# Patient Record
Sex: Male | Born: 1965
Health system: Southern US, Community
[De-identification: ages and names within clinical notes are randomized; demographics above are authoritative.]

## PROBLEM LIST (undated history)

## (undated) ENCOUNTER — Emergency Department (HOSPITAL_COMMUNITY): Admission: EM | Payer: 59 | Source: Home / Self Care

---

## 2010-11-01 ENCOUNTER — Emergency Department (HOSPITAL_COMMUNITY)
Admission: EM | Admit: 2010-11-01 | Discharge: 2010-11-01 | Discharge status: Against Medical Advice | Payer: 59 | Attending: Emergency Medicine | Admitting: Emergency Medicine

## 2010-11-01 DIAGNOSIS — R109 Unspecified abdominal pain: Secondary | ICD-10-CM | POA: Insufficient documentation

## 2010-11-01 LAB — URINALYSIS, ROUTINE W REFLEX MICROSCOPIC
Bilirubin Urine: NEGATIVE
Nitrite: NEGATIVE
Specific Gravity, Urine: 1.019 (ref 1.005–1.030)
Urobilinogen, UA: 1 mg/dL (ref 0.0–1.0)
pH: 6.5 (ref 5.0–8.0)

## 2015-06-16 DIAGNOSIS — E119 Type 2 diabetes mellitus without complications: Secondary | ICD-10-CM | POA: Diagnosis not present

## 2015-06-16 DIAGNOSIS — E785 Hyperlipidemia, unspecified: Secondary | ICD-10-CM | POA: Diagnosis not present

## 2015-06-16 DIAGNOSIS — I10 Essential (primary) hypertension: Secondary | ICD-10-CM | POA: Diagnosis not present

## 2015-06-17 DIAGNOSIS — K625 Hemorrhage of anus and rectum: Secondary | ICD-10-CM | POA: Diagnosis not present

## 2015-06-17 DIAGNOSIS — K5730 Diverticulosis of large intestine without perforation or abscess without bleeding: Secondary | ICD-10-CM | POA: Diagnosis not present

## 2015-06-17 DIAGNOSIS — K921 Melena: Secondary | ICD-10-CM | POA: Diagnosis not present

## 2015-09-08 DIAGNOSIS — Z125 Encounter for screening for malignant neoplasm of prostate: Secondary | ICD-10-CM | POA: Diagnosis not present

## 2015-09-08 DIAGNOSIS — E119 Type 2 diabetes mellitus without complications: Secondary | ICD-10-CM | POA: Diagnosis not present

## 2015-09-08 DIAGNOSIS — I10 Essential (primary) hypertension: Secondary | ICD-10-CM | POA: Diagnosis not present

## 2015-09-10 MED FILL — VALSARTAN-HCTZ 320-25 MG TA: 320-25 | 90 days supply | Qty: 90 | Fill #0

## 2015-09-10 MED FILL — PRAVASTATIN NA 40 MG TAB: 40 | 90 days supply | Qty: 90 | Fill #1

## 2015-09-15 DIAGNOSIS — I10 Essential (primary) hypertension: Secondary | ICD-10-CM | POA: Diagnosis not present

## 2015-09-15 DIAGNOSIS — E785 Hyperlipidemia, unspecified: Secondary | ICD-10-CM | POA: Diagnosis not present

## 2015-09-15 DIAGNOSIS — E119 Type 2 diabetes mellitus without complications: Secondary | ICD-10-CM | POA: Diagnosis not present

## 2015-09-15 DIAGNOSIS — D649 Anemia, unspecified: Secondary | ICD-10-CM | POA: Diagnosis not present

## 2015-09-15 MED FILL — metFORMIN HCL 500 MG TABS: 500 | 90 days supply | Qty: 180 | Fill #0

## 2015-12-07 DIAGNOSIS — Z0100 Encounter for examination of eyes and vision without abnormal findings: Secondary | ICD-10-CM | POA: Diagnosis not present

## 2015-12-08 DIAGNOSIS — I10 Essential (primary) hypertension: Secondary | ICD-10-CM | POA: Diagnosis not present

## 2015-12-08 DIAGNOSIS — E119 Type 2 diabetes mellitus without complications: Secondary | ICD-10-CM | POA: Diagnosis not present

## 2015-12-08 DIAGNOSIS — D649 Anemia, unspecified: Secondary | ICD-10-CM | POA: Diagnosis not present

## 2015-12-10 MED FILL — PRAVASTATIN NA 40 MG TAB: 40 | 90 days supply | Qty: 90 | Fill #2

## 2015-12-10 MED FILL — VALSARTAN-HCTZ 320-25 MG TA: 320-25 | 90 days supply | Qty: 90 | Fill #1

## 2015-12-15 DIAGNOSIS — E785 Hyperlipidemia, unspecified: Secondary | ICD-10-CM | POA: Diagnosis not present

## 2015-12-15 DIAGNOSIS — I10 Essential (primary) hypertension: Secondary | ICD-10-CM | POA: Diagnosis not present

## 2015-12-15 DIAGNOSIS — E119 Type 2 diabetes mellitus without complications: Secondary | ICD-10-CM | POA: Diagnosis not present

## 2015-12-23 MED FILL — BELVIQ 10 MG TABLET: 10 | 30 days supply | Qty: 60 | Fill #0

## 2015-12-29 MED FILL — metFORMIN HCL 500 MG TABS: 500 | 90 days supply | Qty: 180 | Fill #1

## 2016-03-09 MED FILL — PRAVASTATIN NA 40 MG TAB: 40 | 90 days supply | Qty: 90 | Fill #0

## 2016-03-09 MED FILL — VALSARTAN-HCTZ 320-25 MG TA: 320-25 | 90 days supply | Qty: 90 | Fill #2

## 2016-03-14 DIAGNOSIS — I10 Essential (primary) hypertension: Secondary | ICD-10-CM | POA: Diagnosis not present

## 2016-03-14 DIAGNOSIS — D649 Anemia, unspecified: Secondary | ICD-10-CM | POA: Diagnosis not present

## 2016-03-14 DIAGNOSIS — E119 Type 2 diabetes mellitus without complications: Secondary | ICD-10-CM | POA: Diagnosis not present

## 2016-03-17 DIAGNOSIS — Z125 Encounter for screening for malignant neoplasm of prostate: Secondary | ICD-10-CM | POA: Diagnosis not present

## 2016-03-17 DIAGNOSIS — E119 Type 2 diabetes mellitus without complications: Secondary | ICD-10-CM | POA: Diagnosis not present

## 2016-03-17 DIAGNOSIS — E785 Hyperlipidemia, unspecified: Secondary | ICD-10-CM | POA: Diagnosis not present

## 2016-03-17 DIAGNOSIS — I10 Essential (primary) hypertension: Secondary | ICD-10-CM | POA: Diagnosis not present

## 2016-04-14 MED FILL — metFORMIN HCL 500 MG TABS: 500 | 90 days supply | Qty: 180 | Fill #2

## 2016-06-08 MED FILL — VALSARTAN-HCTZ 320-25 MG TA: 320-25 | 90 days supply | Qty: 90 | Fill #3

## 2016-06-08 MED FILL — PRAVASTATIN NA 40 MG TAB: 40 | 90 days supply | Qty: 90 | Fill #1

## 2016-08-03 MED FILL — metFORMIN HCL 500 MG TABS: 500 | 90 days supply | Qty: 180 | Fill #3

## 2016-09-08 MED FILL — VALSARTAN-HCTZ 320-25 MG TA: 320-25 | 90 days supply | Qty: 90 | Fill #4

## 2016-09-12 DIAGNOSIS — I10 Essential (primary) hypertension: Secondary | ICD-10-CM | POA: Diagnosis not present

## 2016-09-12 DIAGNOSIS — E119 Type 2 diabetes mellitus without complications: Secondary | ICD-10-CM | POA: Diagnosis not present

## 2016-09-12 DIAGNOSIS — Z125 Encounter for screening for malignant neoplasm of prostate: Secondary | ICD-10-CM | POA: Diagnosis not present

## 2016-09-12 MED FILL — PRAVASTATIN NA 40 MG TAB: 40 | 90 days supply | Qty: 90 | Fill #0

## 2016-09-18 DIAGNOSIS — Z0000 Encounter for general adult medical examination without abnormal findings: Secondary | ICD-10-CM | POA: Diagnosis not present

## 2016-09-18 DIAGNOSIS — E7800 Pure hypercholesterolemia, unspecified: Secondary | ICD-10-CM | POA: Diagnosis not present

## 2016-09-18 DIAGNOSIS — E118 Type 2 diabetes mellitus with unspecified complications: Secondary | ICD-10-CM | POA: Diagnosis not present

## 2016-09-18 DIAGNOSIS — Z23 Encounter for immunization: Secondary | ICD-10-CM | POA: Diagnosis not present

## 2016-09-18 DIAGNOSIS — I10 Essential (primary) hypertension: Secondary | ICD-10-CM | POA: Diagnosis not present

## 2016-11-15 MED FILL — metFORMIN HCL 500 MG TABS: 500 | 90 days supply | Qty: 180 | Fill #0

## 2016-12-07 MED FILL — PRAVASTATIN NA 40 MG TAB: 40 | 90 days supply | Qty: 90 | Fill #1

## 2016-12-07 MED FILL — VALSARTAN-HCTZ 320-25 MG TA: 320-25 | 90 days supply | Qty: 90 | Fill #0

## 2016-12-08 DIAGNOSIS — H524 Presbyopia: Secondary | ICD-10-CM | POA: Diagnosis not present

## 2017-02-15 MED FILL — metFORMIN HCL 500 MG TABS: 500 | 90 days supply | Qty: 180 | Fill #1

## 2017-03-14 DIAGNOSIS — I10 Essential (primary) hypertension: Secondary | ICD-10-CM | POA: Diagnosis not present

## 2017-03-14 DIAGNOSIS — E118 Type 2 diabetes mellitus with unspecified complications: Secondary | ICD-10-CM | POA: Diagnosis not present

## 2017-03-15 MED FILL — VALSARTAN-HCTZ 320-25 MG TA: 320-25 | 90 days supply | Qty: 90 | Fill #1

## 2017-03-15 MED FILL — PRAVASTATIN NA 40 MG TAB: 40 | 90 days supply | Qty: 90 | Fill #2

## 2017-03-20 DIAGNOSIS — I10 Essential (primary) hypertension: Secondary | ICD-10-CM | POA: Diagnosis not present

## 2017-03-20 DIAGNOSIS — E119 Type 2 diabetes mellitus without complications: Secondary | ICD-10-CM | POA: Diagnosis not present

## 2017-03-20 DIAGNOSIS — Z125 Encounter for screening for malignant neoplasm of prostate: Secondary | ICD-10-CM | POA: Diagnosis not present

## 2017-03-20 DIAGNOSIS — E785 Hyperlipidemia, unspecified: Secondary | ICD-10-CM | POA: Diagnosis not present

## 2017-03-20 DIAGNOSIS — D649 Anemia, unspecified: Secondary | ICD-10-CM | POA: Diagnosis not present

## 2017-05-23 MED FILL — metFORMIN HCL 500 MG TABS: 500 | 90 days supply | Qty: 180 | Fill #2

## 2017-06-11 MED FILL — VALSARTAN-HCTZ 320-25 MG TA: 320-25 | 90 days supply | Qty: 90 | Fill #2

## 2017-06-11 MED FILL — PRAVASTATIN NA 40 MG TAB: 40 | 90 days supply | Qty: 90 | Fill #3

## 2017-09-05 MED FILL — VALSARTAN-HCTZ 320-25 MG TA: 320-25 | 90 days supply | Qty: 90 | Fill #3

## 2017-09-05 MED FILL — metFORMIN HCL 500 MG TABS: 500 | 90 days supply | Qty: 180 | Fill #3

## 2017-09-05 MED FILL — PRAVASTATIN NA 40 MG TAB: 40 | 90 days supply | Qty: 90 | Fill #4

## 2017-09-13 DIAGNOSIS — I10 Essential (primary) hypertension: Secondary | ICD-10-CM | POA: Diagnosis not present

## 2017-09-13 DIAGNOSIS — E119 Type 2 diabetes mellitus without complications: Secondary | ICD-10-CM | POA: Diagnosis not present

## 2017-09-13 DIAGNOSIS — Z125 Encounter for screening for malignant neoplasm of prostate: Secondary | ICD-10-CM | POA: Diagnosis not present

## 2017-09-19 DIAGNOSIS — D649 Anemia, unspecified: Secondary | ICD-10-CM | POA: Diagnosis not present

## 2017-09-19 DIAGNOSIS — Z0000 Encounter for general adult medical examination without abnormal findings: Secondary | ICD-10-CM | POA: Diagnosis not present

## 2017-09-19 DIAGNOSIS — R739 Hyperglycemia, unspecified: Secondary | ICD-10-CM | POA: Diagnosis not present

## 2017-11-22 DIAGNOSIS — M5442 Lumbago with sciatica, left side: Secondary | ICD-10-CM | POA: Diagnosis not present

## 2017-11-22 DIAGNOSIS — E119 Type 2 diabetes mellitus without complications: Secondary | ICD-10-CM | POA: Diagnosis not present

## 2017-11-22 DIAGNOSIS — E785 Hyperlipidemia, unspecified: Secondary | ICD-10-CM | POA: Diagnosis not present

## 2017-11-22 DIAGNOSIS — I10 Essential (primary) hypertension: Secondary | ICD-10-CM | POA: Diagnosis not present

## 2017-11-22 MED FILL — MELOXICAM 15 MG TABLET: 15 | 30 days supply | Qty: 30 | Fill #0

## 2017-11-22 MED FILL — METHOCARBAMOL 500 MG TABS: 500 | 15 days supply | Qty: 60 | Fill #0

## 2017-11-23 MED FILL — traMADol HCL 50 MG TABS: 50 | 5 days supply | Qty: 20 | Fill #0

## 2017-11-24 ENCOUNTER — Other Ambulatory Visit: Payer: Self-pay | Admitting: Internal Medicine

## 2017-11-24 DIAGNOSIS — M5442 Lumbago with sciatica, left side: Secondary | ICD-10-CM

## 2017-12-05 ENCOUNTER — Ambulatory Visit
Admission: RE | Admit: 2017-12-05 | Discharge: 2017-12-05 | Disposition: A | Discharge status: Home / Self Care | Payer: 59 | Source: Ambulatory Visit | Attending: Internal Medicine | Admitting: Internal Medicine

## 2017-12-05 DIAGNOSIS — M5126 Other intervertebral disc displacement, lumbar region: Secondary | ICD-10-CM | POA: Diagnosis not present

## 2017-12-05 DIAGNOSIS — M5442 Lumbago with sciatica, left side: Secondary | ICD-10-CM

## 2017-12-07 MED FILL — VALSARTAN-HCTZ 320-25 MG TA: 320-25 | 90 days supply | Qty: 90 | Fill #4

## 2017-12-10 DIAGNOSIS — H5213 Myopia, bilateral: Secondary | ICD-10-CM | POA: Diagnosis not present

## 2017-12-10 MED FILL — PRAVASTATIN NA 40 MG TAB: 40 | 90 days supply | Qty: 90 | Fill #0

## 2017-12-10 MED FILL — metFORMIN HCL 500 MG TABS: 500 | 90 days supply | Qty: 180 | Fill #0

## 2018-01-14 DIAGNOSIS — I10 Essential (primary) hypertension: Secondary | ICD-10-CM | POA: Diagnosis not present

## 2018-01-14 DIAGNOSIS — M5416 Radiculopathy, lumbar region: Secondary | ICD-10-CM | POA: Diagnosis not present

## 2018-01-14 DIAGNOSIS — M5126 Other intervertebral disc displacement, lumbar region: Secondary | ICD-10-CM | POA: Diagnosis not present

## 2018-01-14 DIAGNOSIS — M545 Low back pain: Secondary | ICD-10-CM | POA: Diagnosis not present

## 2018-01-14 DIAGNOSIS — Z6841 Body Mass Index (BMI) 40.0 and over, adult: Secondary | ICD-10-CM | POA: Diagnosis not present

## 2018-01-29 ENCOUNTER — Other Ambulatory Visit: Payer: Self-pay

## 2018-01-29 ENCOUNTER — Ambulatory Visit: Payer: 59 | Attending: Neurosurgery | Admitting: Physical Therapy

## 2018-01-29 DIAGNOSIS — I10 Essential (primary) hypertension: Secondary | ICD-10-CM | POA: Diagnosis not present

## 2018-01-29 DIAGNOSIS — M5442 Lumbago with sciatica, left side: Secondary | ICD-10-CM | POA: Insufficient documentation

## 2018-01-29 DIAGNOSIS — D649 Anemia, unspecified: Secondary | ICD-10-CM | POA: Diagnosis not present

## 2018-01-29 DIAGNOSIS — M62830 Muscle spasm of back: Secondary | ICD-10-CM | POA: Insufficient documentation

## 2018-01-29 DIAGNOSIS — G8929 Other chronic pain: Secondary | ICD-10-CM | POA: Insufficient documentation

## 2018-01-29 DIAGNOSIS — Z1212 Encounter for screening for malignant neoplasm of rectum: Secondary | ICD-10-CM | POA: Diagnosis not present

## 2018-01-29 DIAGNOSIS — Z0000 Encounter for general adult medical examination without abnormal findings: Secondary | ICD-10-CM | POA: Diagnosis not present

## 2018-01-29 DIAGNOSIS — R739 Hyperglycemia, unspecified: Secondary | ICD-10-CM | POA: Diagnosis not present

## 2018-01-29 DIAGNOSIS — E118 Type 2 diabetes mellitus with unspecified complications: Secondary | ICD-10-CM | POA: Diagnosis not present

## 2018-01-30 NOTE — Therapy (Signed)
[facility] [address] [place], [place], [address] Phone: [phone]   Fax:  [phone]  Physical Therapy Evaluation  Patient Details  Name: Paul AmorKenneth R Lyons MRN: [id number] Date of Birth: 1965/08/11 Referring Provider: Dr Maeola HarmanJoseph Stern    Encounter Date: 01/29/2018  PT End of Session - 01/29/18 1639    Visit Number  1    Number of Visits  12    Date for PT Re-Evaluation  03/12/18    Authorization Type  [facility] [employer]     PT Start Time  1632    PT Stop Time  1715    PT Time Calculation (min)  43 min    Activity Tolerance  Patient tolerated treatment well    Behavior During Therapy  [facility] for tasks assessed/performed       No past medical history on file.   There were no vitals filed for this visit.   Subjective Assessment - 01/29/18 1631    Subjective  Patient has had some tightnes in his back for a long time. In May he was moving theater chairs and he felt a pull in his back. He is now having pain down into his left leg. The pain increase when he is pon his feet.     Currently in Pain?  Yes    Pain Score  5     Pain Location  Back    Pain Orientation  Left    Pain Descriptors / Indicators  Aching    Pain Type  Chronic pain    Pain Radiating Towards  radiating from left buttock into left calf     Pain Onset  More than a month ago    Pain Frequency  Constant    Aggravating Factors   Standing on herd floors     Pain Relieving Factors  moving; advil helps but dosent stop it; walking to a point     Effect of Pain on Daily Activities  difficulty standing at work.          [facility] - 01/30/18 0001      Assessment   Medical Diagnosis  Low Back Pain withLeft zRadicular Pain    Referring Provider  Dr Maeola HarmanJoseph Stern     Onset Date/Surgical Date  --   End of May    Hand Dominance  Right    Next MD Visit  02/18/2018     Prior Therapy  None       Precautions   Precautions  None      Restrictions   Weight Bearing  Restrictions  No      Balance Screen   Has the patient fallen in the past 6 months  No    Has the patient had a decrease in activity level because of a fear of falling?   No    Is the patient reluctant to leave their home because of a fear of falling?   No      Home Environment   Additional Comments  Has steps but does not hurt his back       Prior Function   Level of Independence  Independent    Vocation  Full time employment    Vocation Requirements  Works in [employer]. He is on his feet for a long period of time     Leisure  Just does his leaisure activity       Cognition   Overall Cognitive Status  Within Functional Limits for tasks  assessed    Attention  Focused    Focused Attention  Appears intact    Memory  Appears intact    Awareness  Appears intact    Problem Solving  Appears intact      Observation/Other Assessments   Focus on Therapeutic Outcomes (FOTO)   21% limitation       Sensation   Light Touch  Appears Intact    Additional Comments  Pain radiating into left buttock and into his left calf       Coordination   Gross Motor Movements are Fluid and Coordinated  Yes    Fine Motor Movements are Fluid and Coordinated  Yes      Posture/Postural Control   Posture Comments  rounded shoulders; ofrward head       AROM   Lumbar Flexion  minor pain with end range flexion     Lumbar Extension  Improved pain with extension    Lumbar - Right Side Bend  no limit no limit     Lumbar - Left Side Bend  no limit     Lumbar - Right Rotation  no limit     Lumbar - Left Rotation  no limit       PROM   Overall PROM Comments  minor pain with end range left hip IR       Strength   Overall Strength Comments  5/5/ gross       Flexibility   Soft Tissue Assessment /Muscle Length  yes    Hamstrings  limited 30 degrees bilateral 90/90       Palpation   Palpation comment  Spasming on left between L3 and L5       Special Tests   Other special tests  (+) SLR at 30 degrees       Ambulation/Gait   Gait Comments  decreased hip flexion bilateral                 Objective measurements completed on examination: See above findings.      [facility] Adult PT Treatment/Exercise - 01/30/18 0001      Lumbar Exercises: Stretches   Lower Trunk Rotation Limitations  x10 bilateral     Piriformis Stretch Limitations  3x20 sec stretch       Lumbar Exercises: Standing   Other Standing Lumbar Exercises  tennis ball triger point release       Lumbar Exercises: Supine   AB Set Limitations  reviewed abdominal breathing       Lumbar Exercises: Prone   Other Prone Lumbar Exercises  prone on elbows                PT Short Term Goals - 01/29/18 1640      PT SHORT TERM GOAL #1   Title  Patient will report pain no > 2/10 with no radiating pain into the lower leg     Time  3    Period  Weeks    Status  New    Target Date  02/19/18      PT SHORT TERM GOAL #2   Title  Patient will demsotrate full lumbar motion without radicualr pain     Time  3    Period  Weeks    Status  New      PT SHORT TERM GOAL #3   Title  Patient will be idependent with basic HEP     Time  3    Period  Weeks  Status  New    Target Date  02/19/18        PT Long Term Goals - 01/29/18 1641      PT LONG TERM GOAL #1   Title  Patient will stand for 1 hour in order to perfrom normal work activity    Time  6    Period  Weeks    Status  New    Target Date  03/12/18      PT LONG TERM GOAL #2   Title  Patient will wake up in the morning without significant pain or stiffness     Time  6    Period  Weeks    Status  New    Target Date  03/12/18      PT LONG TERM GOAL #3   Title  Patient will demsotrate a 14% limitation on FOTO     Time  6    Period  Weeks    Status  New    Target Date  03/13/18             Plan - 01/30/18 1306    Clinical Impression Statement  Patient is a 52 year old male with left sided lower back pain with left sided radiculopathy. Signs and  symptoms are consitent with a left paracentral disc buldge. He has increased pain when standing for too long and when bending and lifting. He has focal spasming around L4 and L5 on the left. Ot does  not appear to have effected his strength yet. He stands on concrete most of the day. He would benefit from skilled therapy to centralize pain and increase fucntion.     History and Personal Factors relevant to plan of care:  morbid obesity     Clinical Presentation  Stable    Clinical Decision Making  Low    PT Frequency  2x / week    PT Duration  6 weeks    PT Treatment/Interventions  ADLs/Self Care Home Management;Electrical Stimulation;Cryotherapy;Moist Heat;Traction;Ultrasound;Therapeutic activities;Therapeutic exercise;Gait training;Stair training;Patient/family education;Neuromuscular re-education;Manual techniques;Passive range of motion;Dry needling;Taping;Splinting    PT Next Visit Plan  review tolerance to prone on elbows; progres to prone press up if able; begin core strengthening; consdier supine march; clam shell , bridge; row, extension all with abdominal breathing; will neeed lifting education at some point; manual therapy and TPDN as needed.     PT Home Exercise Plan  prone on elbows; abdominal breathing; pirifromis stretch; tennis ball trigger point release; LTR     Consulted and Agree with Plan of Care  Patient       Patient will benefit from skilled therapeutic intervention in order to improve the following deficits and impairments:  Pain, Decreased activity tolerance, Decreased endurance, Increased muscle spasms, Decreased range of motion  Visit Diagnosis: Chronic left-sided low back pain with left-sided sciatica - Plan: PT plan of care cert/re-cert  Muscle spasm of back - Plan: PT plan of care cert/re-cert     Problem List There are no active problems to display for this patient.   Dessie Coma  PT DPT  01/30/2018, 1:21 PM  [facility] [address] [place], [place], [address] Phone: [phone]   Fax:  [phone]  Name: Paul Lyons MRN: [id number] Date of Birth: 04-30-1966

## 2018-02-06 DIAGNOSIS — D649 Anemia, unspecified: Secondary | ICD-10-CM | POA: Diagnosis not present

## 2018-02-06 DIAGNOSIS — E119 Type 2 diabetes mellitus without complications: Secondary | ICD-10-CM | POA: Diagnosis not present

## 2018-02-06 DIAGNOSIS — I10 Essential (primary) hypertension: Secondary | ICD-10-CM | POA: Diagnosis not present

## 2018-02-06 DIAGNOSIS — E785 Hyperlipidemia, unspecified: Secondary | ICD-10-CM | POA: Diagnosis not present

## 2018-02-15 ENCOUNTER — Encounter

## 2018-02-19 ENCOUNTER — Ambulatory Visit: Payer: 59 | Attending: Neurosurgery | Admitting: Physical Therapy

## 2018-02-19 ENCOUNTER — Encounter: Payer: Self-pay | Admitting: Physical Therapy

## 2018-02-19 DIAGNOSIS — M5442 Lumbago with sciatica, left side: Secondary | ICD-10-CM | POA: Insufficient documentation

## 2018-02-19 DIAGNOSIS — M62830 Muscle spasm of back: Secondary | ICD-10-CM | POA: Diagnosis not present

## 2018-02-19 DIAGNOSIS — G8929 Other chronic pain: Secondary | ICD-10-CM | POA: Diagnosis not present

## 2018-02-19 NOTE — Therapy (Addendum)
[place], [place], [address] Phone: [phone]   Fax:  [phone]  Physical Therapy Treatment  Patient Details  Name: Paul Lyons MRN: [id number] Date of Birth: 04-08-1966 Referring Provider: Dr Erline Levine    Encounter Date: 02/19/2018  PT End of Session - 02/19/18 1502    Visit Number  2    Number of Visits  12    Date for PT Re-Evaluation  03/12/18    Authorization Type  [facility]     PT Start Time  1503    PT Stop Time  9983    PT Time Calculation (min)  41 min    Activity Tolerance  Patient tolerated treatment well       History reviewed. No pertinent past medical history.  History reviewed. No pertinent surgical history.  There were no vitals filed for this visit.  Subjective Assessment - 02/19/18 1503    Subjective  Pt reports that the back is not as bad however he does tingling into the lateral Lt leg.     Currently in Pain?  Yes    Pain Score  5     Pain Location  Back    Pain Orientation  Left    Pain Descriptors / Indicators  Dull;Aching;Numbness;Tingling    Pain Type  Chronic pain    Pain Onset  More than a month ago    Pain Frequency  Intermittent    Aggravating Factors   sitting in different positions    Pain Relieving Factors  walking          [facility] PT Assessment - 02/19/18 0001      Assessment   Medical Diagnosis  Low Back Pain withLeft zRadicular Pain                   [facility] Adult PT Treatment/Exercise - 02/19/18 0001      Exercises   Exercises  Lumbar      Lumbar Exercises: Stretches   Passive Hamstring Stretch  1 rep;Left;Right;30 seconds   supine with strap   Single Knee to Chest Stretch  20 seconds    Quad Stretch  Left;Right;2 reps;30 seconds   prone with strap   ITB Stretch  Left;Right;1 rep;60 seconds   cross body with strap     Lumbar Exercises: Supine   AB Set Limitations  10x5sec isometric hip flexion , attempted table top - hurt  the low back.    LTR with abdominal engagement to lift knees up     Lumbar Exercises: Prone   Other Prone Lumbar Exercises  pelvic press series, lower body only . Verbal cues for form            PT Education - 02/19/18 1520    Education Details  HEP progression extension bases    Person(s) Educated  Patient    Methods  Explanation;Demonstration;Handout    Comprehension  Returned demonstration;Verbalized understanding       PT Short Term Goals - 02/19/18 1527      PT SHORT TERM GOAL #1   Title  Patient will report pain no > 2/10 with no radiating pain into the lower leg     Status  On-going      PT SHORT TERM GOAL #2   Title  Patient will demsotrate full lumbar motion without radicualr pain     Status  On-going      PT SHORT TERM GOAL #3   Title  Patient will be idependent with basic HEP     Status  Achieved        PT Long Term Goals - 02/19/18 1527      PT LONG TERM GOAL #1   Title  Patient will stand for 1 hour in order to perfrom normal work activity    Status  On-going      PT LONG TERM GOAL #2   Title  Patient will wake up in the morning without significant pain or stiffness     Status  On-going      PT LONG TERM GOAL #3   Title  Patient will demsotrate a 14% limitation on FOTO     Status  On-going            Plan - 02/19/18 1528    Clinical Impression Statement  Yvone Neu has less back pain however sligthly more leg pain.  Worked on exercise into extension based program with decreased leg symptoms.  He is very weak in his core and would benefit from more therapy to address this.  He has met one goal, however it is only his second visit , tingling returned to Lt lower leg with weightbearing    PT Frequency  2x / week    PT Duration  6 weeks    PT Treatment/Interventions  ADLs/Self Care Home Management;Electrical Stimulation;Cryotherapy;Moist Heat;Traction;Ultrasound;Therapeutic activities;Therapeutic exercise;Gait training;Stair training;Patient/family  education;Neuromuscular re-education;Manual techniques;Passive range of motion;Dry needling;Taping;Splinting    PT Next Visit Plan  progress core work, focus on extension based program.      Consulted and Agree with Plan of Care  Patient       Patient will benefit from skilled therapeutic intervention in order to improve the following deficits and impairments:  Pain, Decreased activity tolerance, Decreased endurance, Increased muscle spasms, Decreased range of motion  Visit Diagnosis: Chronic left-sided low back pain with left-sided sciatica  Muscle spasm of back     Problem List There are no active problems to display for this patient.   Jeral Pinch PT 02/19/2018, 3:48 PM  [facility] [address] [place], [place], [address] Phone: [phone]   Fax:  [phone]  Name: DERRALL HICKS MRN: [id number] Date of Birth: 1965-08-12  PHYSICAL THERAPY DISCHARGE SUMMARY  Visits from Start of Care: 2  Current functional level related to goals / functional outcomes: unknown   Remaining deficits: unknown   Education / Equipment: Initial HEP Plan:                                                    Patient goals were partially met. Patient is being discharged due to not returning since the last visit.  ?????     Jeral Pinch, PT 03/29/18 9:41 AM

## 2018-02-25 ENCOUNTER — Ambulatory Visit: Payer: 59 | Admitting: Physical Therapy

## 2018-03-04 ENCOUNTER — Ambulatory Visit: Payer: 59 | Admitting: Physical Therapy

## 2018-03-11 DIAGNOSIS — Z6841 Body Mass Index (BMI) 40.0 and over, adult: Secondary | ICD-10-CM | POA: Diagnosis not present

## 2018-03-11 DIAGNOSIS — M5416 Radiculopathy, lumbar region: Secondary | ICD-10-CM | POA: Diagnosis not present

## 2018-03-11 DIAGNOSIS — M545 Low back pain: Secondary | ICD-10-CM | POA: Diagnosis not present

## 2018-03-11 DIAGNOSIS — M5126 Other intervertebral disc displacement, lumbar region: Secondary | ICD-10-CM | POA: Diagnosis not present

## 2018-03-11 DIAGNOSIS — I10 Essential (primary) hypertension: Secondary | ICD-10-CM | POA: Diagnosis not present

## 2018-03-11 MED FILL — VALSARTAN-HCTZ 320-25 MG TA: 320-25 | 90 days supply | Qty: 90 | Fill #0

## 2018-03-11 MED FILL — PRAVASTATIN NA 40 MG TAB: 40 | 90 days supply | Qty: 90 | Fill #1

## 2018-03-29 MED FILL — MELOXICAM 15 MG TABLET: 15 | 30 days supply | Qty: 30 | Fill #1

## 2018-06-19 MED FILL — PRAVASTATIN NA 40 MG TAB: 40 | 90 days supply | Qty: 90 | Fill #2

## 2018-06-19 MED FILL — VALSARTAN-HCTZ 320-25 MG TA: 320-25 | 90 days supply | Qty: 90 | Fill #0

## 2018-07-22 MED FILL — metFORMIN HCL 500 MG TABS: 500 | 90 days supply | Qty: 180 | Fill #1

## 2018-09-09 MED FILL — PRAVASTATIN NA 40 MG TAB: 40 | 90 days supply | Qty: 90 | Fill #0

## 2018-09-09 MED FILL — VALSARTAN-HCTZ 320-25 MG TA: 320-25 | 90 days supply | Qty: 90 | Fill #0

## 2018-11-20 MED FILL — metFORMIN HCL 500 MG TABS: 500 | 90 days supply | Qty: 180 | Fill #0

## 2018-12-11 MED FILL — VALSARTAN-HCTZ 320-25 MG TA: 320-25 | 30 days supply | Qty: 30 | Fill #0

## 2018-12-11 MED FILL — PRAVASTATIN NA 40 MG TAB: 40 | 90 days supply | Qty: 90 | Fill #0

## 2019-01-13 MED FILL — VALSARTAN-HCTZ 320-25 MG TA: 320-25 | 30 days supply | Qty: 30 | Fill #1

## 2019-02-05 ENCOUNTER — Other Ambulatory Visit: Payer: Self-pay | Admitting: Neurosurgery

## 2019-02-05 DIAGNOSIS — M5416 Radiculopathy, lumbar region: Secondary | ICD-10-CM

## 2019-02-10 ENCOUNTER — Inpatient Hospital Stay: Admission: RE | Admit: 2019-02-10 | Payer: 59 | Source: Ambulatory Visit

## 2019-02-12 MED FILL — VALSARTAN-HCTZ 320-25 MG TA: 320-25 | 30 days supply | Qty: 30 | Fill #2

## 2019-02-14 ENCOUNTER — Other Ambulatory Visit: Payer: Self-pay

## 2019-02-14 ENCOUNTER — Ambulatory Visit
Admission: RE | Admit: 2019-02-14 | Discharge: 2019-02-14 | Disposition: A | Discharge status: Home / Self Care | Payer: No Typology Code available for payment source | Source: Ambulatory Visit | Attending: Neurosurgery | Admitting: Neurosurgery

## 2019-02-14 DIAGNOSIS — M5416 Radiculopathy, lumbar region: Secondary | ICD-10-CM

## 2019-02-14 MED ORDER — IOPAMIDOL (ISOVUE-M 200) INJECTION 41%
1.0000 mL | Freq: Once | INTRAMUSCULAR | Status: AC
  Administered 2019-02-14: 1 mL via EPIDURAL

## 2019-02-14 MED ORDER — METHYLPREDNISOLONE ACETATE 40 MG/ML INJ SUSP (RADIOLOG
120.0000 mg | Freq: Once | INTRAMUSCULAR | Status: AC
  Administered 2019-02-14: 120 mg via EPIDURAL

## 2019-02-14 NOTE — Discharge Instructions (Signed)

## 2019-03-12 MED FILL — PRAVASTATIN NA 40 MG TAB: 40 | 90 days supply | Qty: 90 | Fill #1

## 2019-03-12 MED FILL — VALSARTAN-HCTZ 320-25 MG TA: 320-25 | 30 days supply | Qty: 30 | Fill #0

## 2019-04-07 MED FILL — FARXIGA 5 MG TABLET: 5 | 30 days supply | Qty: 30 | Fill #0

## 2019-04-07 MED FILL — VALSARTAN-HCTZ 320-25 MG TA: 320-25 | 90 days supply | Qty: 90 | Fill #0

## 2019-04-09 MED FILL — metFORMIN HCL 500 MG TABS: 500 | 90 days supply | Qty: 180 | Fill #0

## 2019-06-17 MED FILL — FARXIGA 5 MG TABLET: 5 | 30 days supply | Qty: 30 | Fill #2

## 2019-06-17 MED FILL — PRAVASTATIN NA 40 MG TAB: 40 | 90 days supply | Qty: 90 | Fill #2

## 2019-07-21 MED FILL — FARXIGA 5 MG TABLET: 5 | 30 days supply | Qty: 30 | Fill #3

## 2019-07-21 MED FILL — VALSARTAN-HCTZ 320-25 MG TA: 320-25 | 90 days supply | Qty: 90 | Fill #1

## 2019-07-21 MED FILL — metFORMIN HCL 500 MG TABS: 500 | 90 days supply | Qty: 180 | Fill #1

## 2019-08-08 MED FILL — FARXIGA 10 MG TABLET: 10 | 90 days supply | Qty: 90 | Fill #0

## 2019-09-16 MED FILL — PRAVASTATIN NA 40 MG TAB: 40 | 90 days supply | Qty: 90 | Fill #3

## 2019-10-14 MED FILL — VALSARTAN-HCTZ 320-25 MG TA: 320-25 | 90 days supply | Qty: 90 | Fill #2

## 2019-11-04 MED FILL — FARXIGA 10 MG TABLET: 10 | 90 days supply | Qty: 90 | Fill #1

## 2019-11-27 MED FILL — PRAVASTATIN NA 40 MG TAB: 40 | 90 days supply | Qty: 90 | Fill #4

## 2019-12-08 MED FILL — METFORMIN HCL 500 MG TABS: 500 | 90 days supply | Qty: 180 | Fill #0

## 2019-12-10 MED FILL — DICLOFENAC SOD EC 75 MG TAB: 75 | 30 days supply | Qty: 60 | Fill #0

## 2020-01-12 MED FILL — VALSARTAN-HCTZ 320-25 MG TA: 320-25 | 90 days supply | Qty: 90 | Fill #3

## 2020-02-23 MED FILL — FARXIGA 10 MG TABLET: 10 | 90 days supply | Qty: 90 | Fill #2

## 2020-04-08 ENCOUNTER — Other Ambulatory Visit (HOSPITAL_COMMUNITY): Payer: Self-pay | Admitting: Internal Medicine

## 2020-04-08 MED FILL — PRAVASTATIN NA 40 MG TAB: 40 | 90 days supply | Qty: 90 | Fill #0

## 2020-04-08 MED FILL — METFORMIN HCL 500 MG TABS: 500 | 90 days supply | Qty: 180 | Fill #1

## 2020-04-19 ENCOUNTER — Other Ambulatory Visit (HOSPITAL_COMMUNITY): Payer: Self-pay | Admitting: Internal Medicine

## 2020-04-19 MED FILL — VALSARTAN-HCTZ 320-25 MG TA: 320-25 | 90 days supply | Qty: 90 | Fill #0

## 2020-04-21 ENCOUNTER — Other Ambulatory Visit (HOSPITAL_COMMUNITY): Payer: Self-pay | Admitting: Internal Medicine

## 2020-04-21 MED FILL — PRAVASTATIN NA 40 MG TAB: 40 | 90 days supply | Qty: 90 | Fill #0

## 2020-04-21 MED FILL — METFORMIN HCL 500 MG TABS: 500 | 90 days supply | Qty: 180 | Fill #0

## 2020-04-23 IMAGING — MR MR LUMBAR SPINE W/O CM
4 of 5 series · 18 of 48 positions shown · non-contrast
Comparison: None.

CLINICAL DATA: Low back and LEFT leg pain. Symptoms for a few
months.

EXAM:
MRI LUMBAR SPINE WITHOUT CONTRAST
TECHNIQUE: Multiplanar, multisequence MR imaging of the lumbar spine was
performed. No intravenous contrast was administered.

[Series 5: T2 · sagittal · 4.0mm · 0.73mm/px · 6 of 15 slices shown (1 of 2)]
[im 1/15]
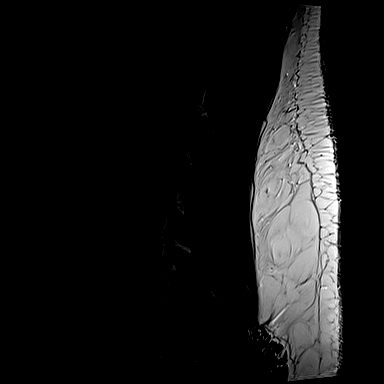
[im 3/15]
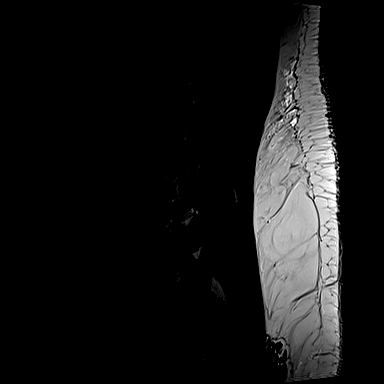
[im 6/15]
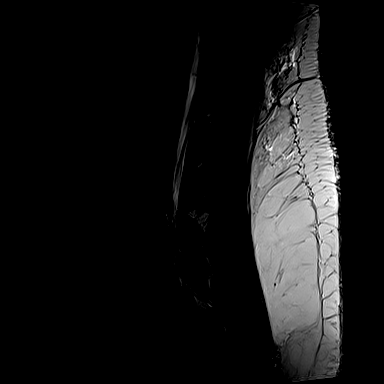
[im 9/15]
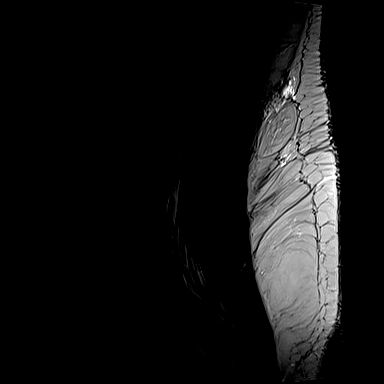
[im 12/15]
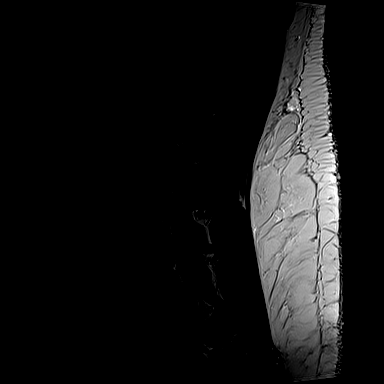
[im 15/15]
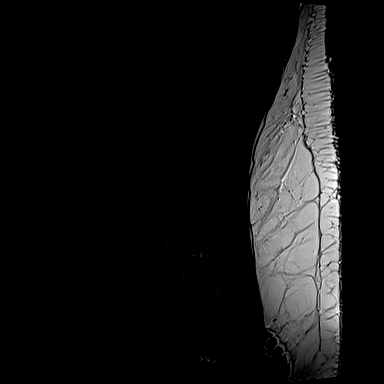

[Series 7: T1 · sagittal · 4.0mm · 0.73mm/px · 3 of 15 slices shown (1 of 2)]
[im 1/15]
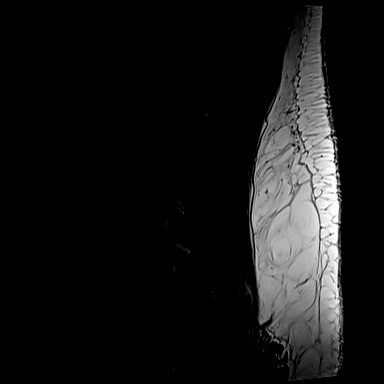
[im 8/15]
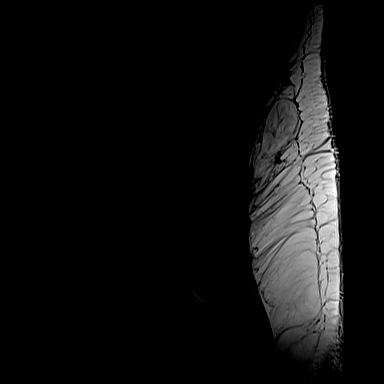
[im 15/15]
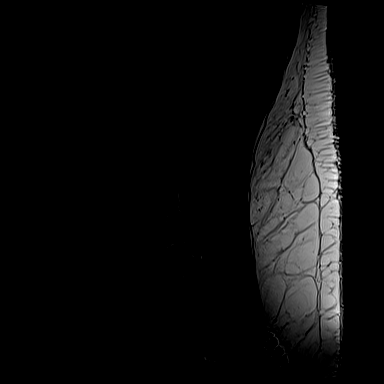

[Series 10: T2 · axial · 4.0mm · 0.30mm/px · z∈[-19,+184]mm · 6 of 47 slices shown (2 of 2)]
[im 4/47]
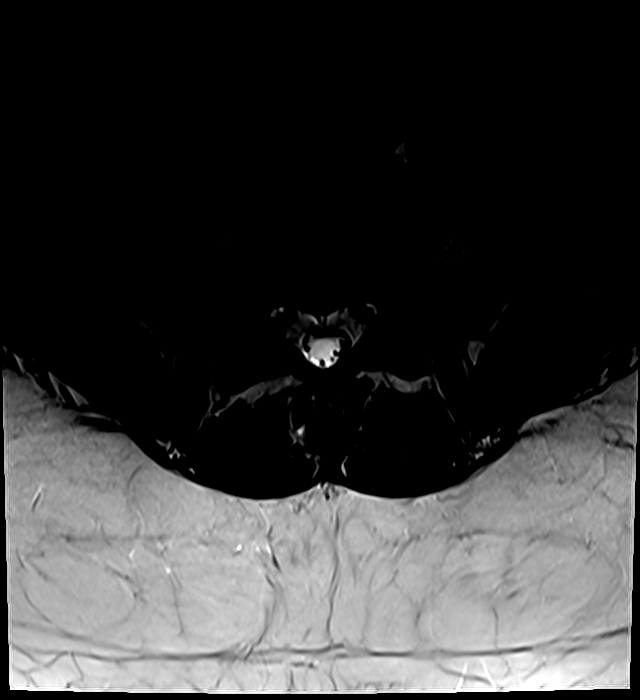
[im 7/47]
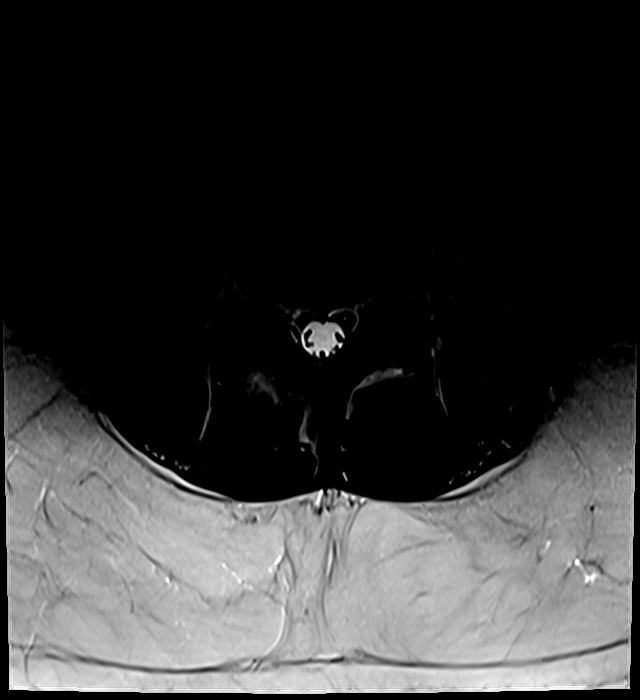
[im 10/47]
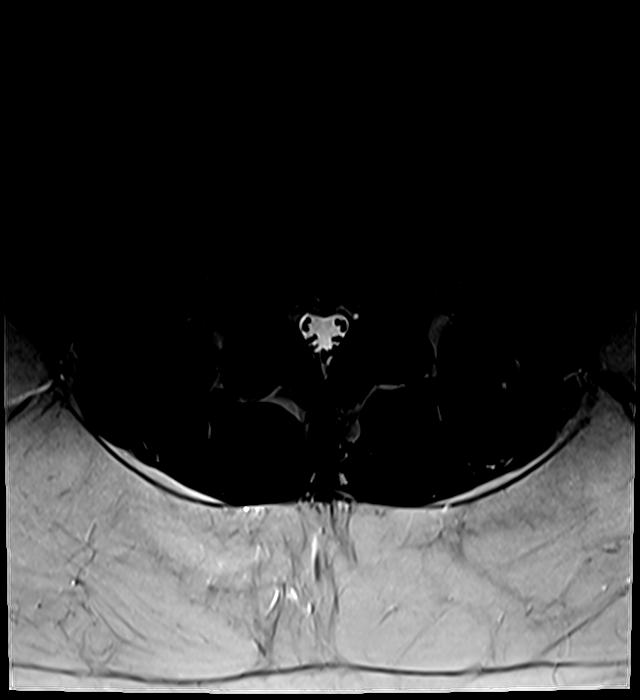
[im 16/47]
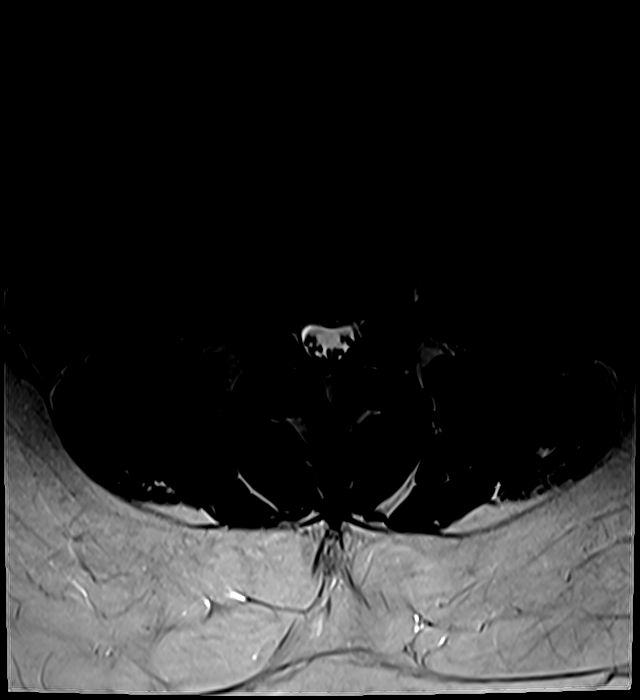
[im 25/47]
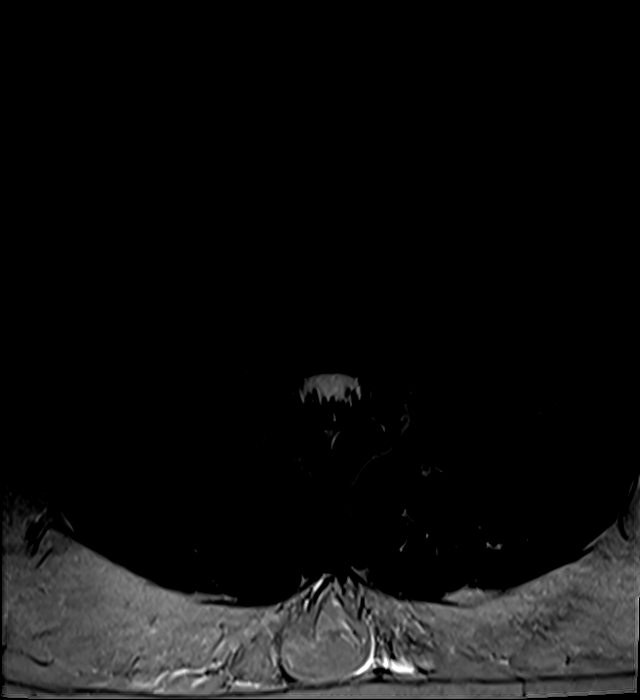
[im 40/47]
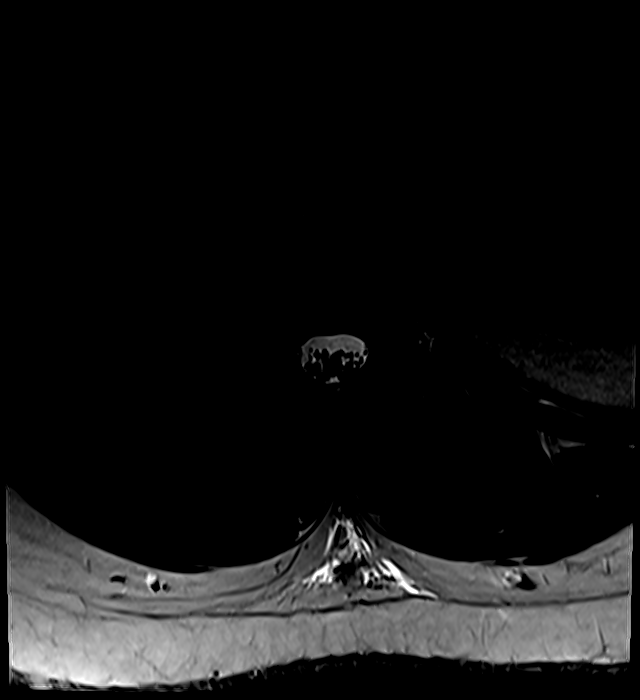

[Series 13: T1 · axial · 4.0mm · 0.30mm/px · z∈[-4,+184]mm · 3 of 47 slices shown (2 of 2)]
[im 7/47]
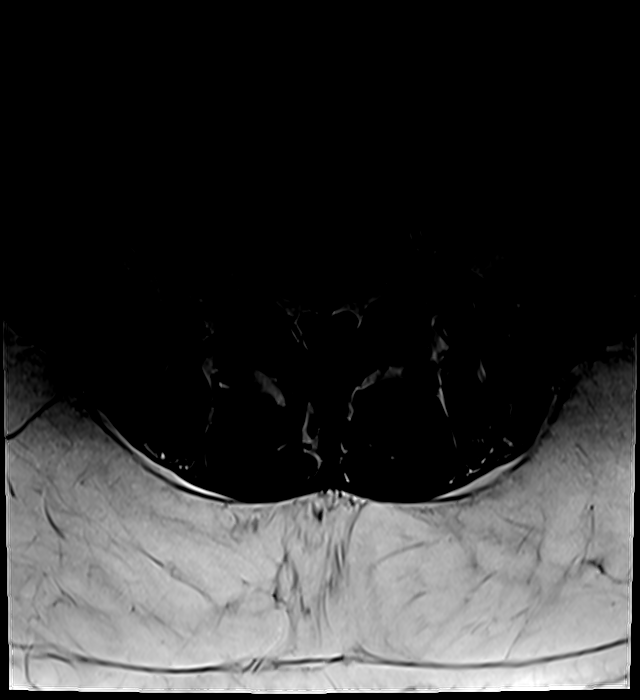
[im 25/47]
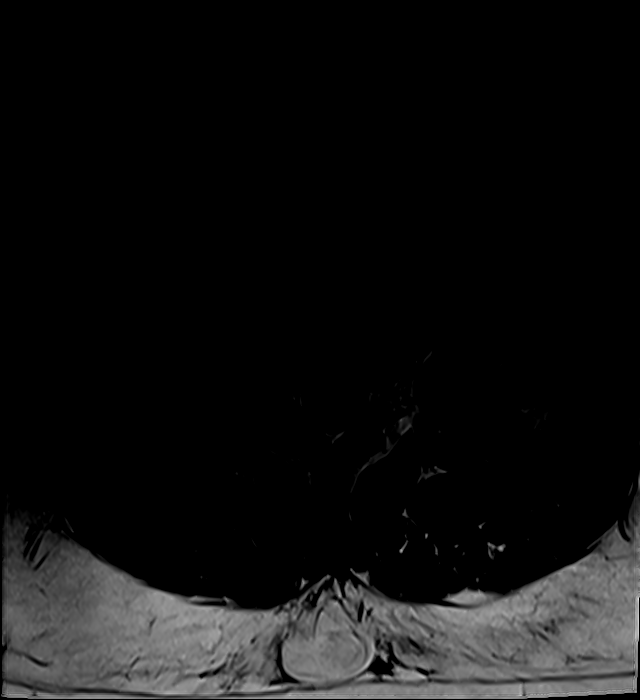
[im 40/47]
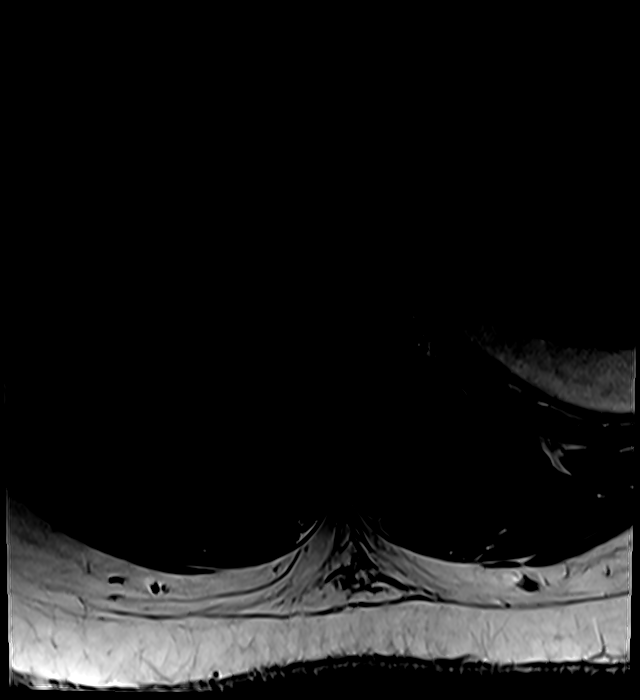

[18 of 48 positions shown; findings below may reference images not displayed]

FINDINGS: Segmentation:  Standard.

Alignment:  Anatomic.

Vertebrae:  No worrisome osseous lesion.

Conus medullaris and cauda equina: Conus extends to the L1. level.
Conus and cauda equina appear normal.

Paraspinal and other soft tissues: Increased body habitus without
features of epidural lipomatosis.

Disc levels:

L1-L2:  Normal.

L2-L3:  Normal.

L3-L4:  Normal.

L4-L5: Disc desiccation with preserved disc height. Central and
leftward extrusion. Cephalad migrated free fragment. Facet
arthropathy. LEFT L5 neural impingement.

L5-S1:  Normal disc space.  Mild facet arthropathy.  No impingement.
IMPRESSION: Central and leftward extrusion at L4-5. Cephalad migrated free
fragment. LEFT L5 neural impingement.

Lower lumbar facet arthropathy.

## 2020-05-31 MED FILL — FARXIGA 10 MG TABLET: 10 | 90 days supply | Qty: 90 | Fill #3

## 2020-07-27 MED FILL — METFORMIN HCL 500 MG TABS: 500 | 90 days supply | Qty: 180 | Fill #1

## 2020-07-27 MED FILL — PRAVASTATIN NA 40 MG TAB: 40 | 90 days supply | Qty: 90 | Fill #1

## 2020-07-27 MED FILL — VALSARTAN-HCTZ 320-25 MG TA: 320-25 | 90 days supply | Qty: 90 | Fill #1

## 2020-08-31 ENCOUNTER — Other Ambulatory Visit (HOSPITAL_COMMUNITY): Payer: Self-pay | Admitting: Internal Medicine

## 2020-08-31 MED FILL — FARXIGA 10 MG TABLET: 10 | 90 days supply | Qty: 90 | Fill #0

## 2020-10-26 ENCOUNTER — Other Ambulatory Visit (HOSPITAL_COMMUNITY): Payer: Self-pay

## 2020-10-26 MED FILL — Valsartan-Hydrochlorothiazide Tab 320-25 MG: ORAL | 90 days supply | Qty: 90 | Fill #0 | Status: AC

## 2020-10-26 MED FILL — Pravastatin Sodium Tab 40 MG: ORAL | 90 days supply | Qty: 90 | Fill #0 | Status: AC

## 2020-10-27 ENCOUNTER — Other Ambulatory Visit (HOSPITAL_COMMUNITY): Payer: Self-pay

## 2020-12-08 ENCOUNTER — Other Ambulatory Visit (HOSPITAL_COMMUNITY): Payer: Self-pay

## 2020-12-08 MED ORDER — METFORMIN HCL 500 MG PO TABS
500.0000 mg | ORAL_TABLET | Freq: Two times a day (BID) | ORAL | 2 refills | Status: AC
  Filled 2020-12-08: qty 180, 90d supply, fill #0
  Filled 2021-04-26: qty 180, 90d supply, fill #1
  Filled 2021-10-10: qty 180, 90d supply, fill #2

## 2020-12-08 MED FILL — Dapagliflozin Propanediol Tab 10 MG (Base Equivalent): ORAL | 90 days supply | Qty: 90 | Fill #0 | Status: AC

## 2021-02-15 ENCOUNTER — Other Ambulatory Visit (HOSPITAL_COMMUNITY): Payer: Self-pay

## 2021-02-15 MED FILL — Pravastatin Sodium Tab 40 MG: ORAL | 90 days supply | Qty: 90 | Fill #1 | Status: AC

## 2021-02-15 MED FILL — Valsartan-Hydrochlorothiazide Tab 320-25 MG: ORAL | 90 days supply | Qty: 90 | Fill #1 | Status: AC

## 2021-04-26 ENCOUNTER — Other Ambulatory Visit (HOSPITAL_COMMUNITY): Payer: Self-pay

## 2021-04-26 MED ORDER — PRAVASTATIN SODIUM 40 MG PO TABS
40.0000 mg | ORAL_TABLET | Freq: Every day | ORAL | 1 refills | Status: DC
  Filled 2021-04-26: qty 90, 90d supply, fill #0
  Filled 2021-08-22: qty 90, 90d supply, fill #1

## 2021-04-26 MED FILL — Dapagliflozin Propanediol Tab 10 MG (Base Equivalent): ORAL | 90 days supply | Qty: 90 | Fill #1 | Status: AC

## 2021-04-27 ENCOUNTER — Other Ambulatory Visit (HOSPITAL_COMMUNITY): Payer: Self-pay

## 2021-04-28 ENCOUNTER — Other Ambulatory Visit (HOSPITAL_COMMUNITY): Payer: Self-pay

## 2021-04-29 ENCOUNTER — Other Ambulatory Visit (HOSPITAL_COMMUNITY): Payer: Self-pay

## 2021-05-02 ENCOUNTER — Other Ambulatory Visit (HOSPITAL_COMMUNITY): Payer: Self-pay

## 2021-05-02 ENCOUNTER — Other Ambulatory Visit: Payer: Self-pay

## 2021-05-02 MED ORDER — VALSARTAN-HYDROCHLOROTHIAZIDE 320-25 MG PO TABS
1.0000 | ORAL_TABLET | Freq: Every day | ORAL | 1 refills | Status: DC
  Filled 2021-05-02: qty 90, 90d supply, fill #0
  Filled 2021-08-22: qty 90, 90d supply, fill #1

## 2021-08-22 ENCOUNTER — Other Ambulatory Visit (HOSPITAL_COMMUNITY): Payer: Self-pay

## 2021-08-22 MED FILL — Dapagliflozin Propanediol Tab 10 MG (Base Equivalent): ORAL | 90 days supply | Qty: 90 | Fill #2 | Status: AC

## 2021-10-10 ENCOUNTER — Other Ambulatory Visit (HOSPITAL_COMMUNITY): Payer: Self-pay

## 2021-11-28 ENCOUNTER — Other Ambulatory Visit (HOSPITAL_COMMUNITY): Payer: Self-pay

## 2021-11-28 MED ORDER — VALSARTAN-HYDROCHLOROTHIAZIDE 320-25 MG PO TABS
1.0000 | ORAL_TABLET | Freq: Every day | ORAL | 2 refills | Status: DC
  Filled 2021-11-28: qty 90, 90d supply, fill #0
  Filled 2022-04-13: qty 90, 90d supply, fill #1
  Filled 2022-07-24: qty 90, 90d supply, fill #2

## 2021-12-20 ENCOUNTER — Other Ambulatory Visit (HOSPITAL_COMMUNITY): Payer: Self-pay

## 2021-12-20 MED ORDER — FARXIGA 10 MG PO TABS
10.0000 mg | ORAL_TABLET | Freq: Every day | ORAL | 3 refills | Status: DC
  Filled 2021-12-20: qty 30, 30d supply, fill #0
  Filled 2022-04-13: qty 30, 30d supply, fill #1
  Filled 2022-05-18: qty 30, 30d supply, fill #2
  Filled 2022-06-11: qty 30, 30d supply, fill #3
  Filled 2022-07-24: qty 30, 30d supply, fill #4
  Filled 2022-08-22: qty 30, 30d supply, fill #5
  Filled 2022-10-23: qty 30, 30d supply, fill #6
  Filled 2022-11-21: qty 30, 30d supply, fill #7

## 2021-12-20 MED ORDER — PRAVASTATIN SODIUM 40 MG PO TABS
40.0000 mg | ORAL_TABLET | Freq: Every day | ORAL | 1 refills | Status: DC
  Filled 2021-12-20: qty 90, 90d supply, fill #0
  Filled 2022-06-11: qty 90, 90d supply, fill #1

## 2022-03-15 ENCOUNTER — Other Ambulatory Visit (HOSPITAL_COMMUNITY): Payer: Self-pay

## 2022-03-17 ENCOUNTER — Other Ambulatory Visit (HOSPITAL_COMMUNITY): Payer: Self-pay

## 2022-03-24 ENCOUNTER — Other Ambulatory Visit (HOSPITAL_COMMUNITY): Payer: Self-pay

## 2022-03-24 MED ORDER — METFORMIN HCL ER 500 MG PO TB24
ORAL_TABLET | ORAL | 1 refills | Status: AC
  Filled 2022-03-24: qty 270, 90d supply, fill #0
  Filled 2022-07-24: qty 270, 90d supply, fill #1

## 2022-03-24 MED ORDER — TRULICITY 0.75 MG/0.5ML ~~LOC~~ SOAJ
0.7500 mg | SUBCUTANEOUS | 0 refills | Status: DC
  Filled 2022-03-24: qty 2, 28d supply, fill #0

## 2022-04-07 ENCOUNTER — Other Ambulatory Visit (HOSPITAL_COMMUNITY): Payer: Self-pay

## 2022-04-07 MED ORDER — TRULICITY 3 MG/0.5ML ~~LOC~~ SOAJ
3.0000 mg | SUBCUTANEOUS | 2 refills | Status: DC
  Filled 2022-04-07 – 2022-04-17 (×3): qty 2, 28d supply, fill #0
  Filled 2022-05-14: qty 2, 28d supply, fill #1
  Filled 2022-06-11: qty 2, 28d supply, fill #2

## 2022-04-13 ENCOUNTER — Other Ambulatory Visit (HOSPITAL_COMMUNITY): Payer: Self-pay

## 2022-04-17 ENCOUNTER — Other Ambulatory Visit (HOSPITAL_COMMUNITY): Payer: Self-pay

## 2022-05-15 ENCOUNTER — Other Ambulatory Visit (HOSPITAL_COMMUNITY): Payer: Self-pay

## 2022-05-18 ENCOUNTER — Other Ambulatory Visit (HOSPITAL_COMMUNITY): Payer: Self-pay

## 2022-07-10 ENCOUNTER — Other Ambulatory Visit (HOSPITAL_COMMUNITY): Payer: Self-pay

## 2022-07-14 ENCOUNTER — Other Ambulatory Visit (HOSPITAL_COMMUNITY): Payer: Self-pay

## 2022-07-14 MED ORDER — TRULICITY 3 MG/0.5ML ~~LOC~~ SOAJ
3.0000 mg | SUBCUTANEOUS | 2 refills | Status: AC
  Filled 2022-07-14: qty 2, 28d supply, fill #0

## 2022-08-22 ENCOUNTER — Other Ambulatory Visit (HOSPITAL_COMMUNITY): Payer: Self-pay

## 2022-08-22 MED ORDER — PRAVASTATIN SODIUM 40 MG PO TABS
40.0000 mg | ORAL_TABLET | Freq: Every day | ORAL | 1 refills | Status: DC
  Filled 2022-09-24: qty 90, 90d supply, fill #0
  Filled 2022-12-27: qty 90, 90d supply, fill #1

## 2022-08-31 ENCOUNTER — Other Ambulatory Visit (HOSPITAL_COMMUNITY): Payer: Self-pay

## 2022-09-25 ENCOUNTER — Other Ambulatory Visit (HOSPITAL_COMMUNITY): Payer: Self-pay

## 2022-09-25 ENCOUNTER — Other Ambulatory Visit: Payer: Self-pay

## 2022-10-19 DIAGNOSIS — D649 Anemia, unspecified: Secondary | ICD-10-CM | POA: Diagnosis not present

## 2022-10-19 DIAGNOSIS — E1122 Type 2 diabetes mellitus with diabetic chronic kidney disease: Secondary | ICD-10-CM | POA: Diagnosis not present

## 2022-10-19 DIAGNOSIS — I10 Essential (primary) hypertension: Secondary | ICD-10-CM | POA: Diagnosis not present

## 2022-10-19 DIAGNOSIS — E7800 Pure hypercholesterolemia, unspecified: Secondary | ICD-10-CM | POA: Diagnosis not present

## 2022-10-23 ENCOUNTER — Other Ambulatory Visit (HOSPITAL_COMMUNITY): Payer: Self-pay

## 2022-10-23 MED ORDER — VALSARTAN-HYDROCHLOROTHIAZIDE 320-25 MG PO TABS
1.0000 | ORAL_TABLET | Freq: Every day | ORAL | 2 refills | Status: DC
  Filled 2022-10-23: qty 90, 90d supply, fill #0
  Filled 2023-01-30: qty 90, 90d supply, fill #1
  Filled 2023-04-30: qty 90, 90d supply, fill #2

## 2022-10-26 ENCOUNTER — Other Ambulatory Visit (HOSPITAL_COMMUNITY): Payer: Self-pay

## 2022-10-26 ENCOUNTER — Other Ambulatory Visit: Payer: Self-pay

## 2022-10-26 DIAGNOSIS — Z125 Encounter for screening for malignant neoplasm of prostate: Secondary | ICD-10-CM | POA: Diagnosis not present

## 2022-10-26 DIAGNOSIS — E7800 Pure hypercholesterolemia, unspecified: Secondary | ICD-10-CM | POA: Diagnosis not present

## 2022-10-26 DIAGNOSIS — E1122 Type 2 diabetes mellitus with diabetic chronic kidney disease: Secondary | ICD-10-CM | POA: Diagnosis not present

## 2022-10-26 MED ORDER — METFORMIN HCL ER 500 MG PO TB24
1000.0000 mg | ORAL_TABLET | Freq: Two times a day (BID) | ORAL | 1 refills | Status: AC
  Filled 2022-10-26: qty 360, 90d supply, fill #0

## 2022-10-26 MED ORDER — OZEMPIC (1 MG/DOSE) 4 MG/3ML ~~LOC~~ SOPN
1.0000 mg | PEN_INJECTOR | SUBCUTANEOUS | 1 refills | Status: DC
  Filled 2022-10-26: qty 3, 28d supply, fill #0
  Filled 2022-11-17 – 2022-11-20 (×2): qty 3, 28d supply, fill #1
  Filled 2022-12-13: qty 3, 28d supply, fill #2
  Filled 2023-01-13: qty 3, 28d supply, fill #3
  Filled 2023-02-10: qty 3, 28d supply, fill #4
  Filled 2023-03-10: qty 3, 28d supply, fill #5

## 2022-11-17 ENCOUNTER — Other Ambulatory Visit (HOSPITAL_COMMUNITY): Payer: Self-pay

## 2022-12-27 ENCOUNTER — Other Ambulatory Visit (HOSPITAL_COMMUNITY): Payer: Self-pay

## 2022-12-27 MED ORDER — DAPAGLIFLOZIN PROPANEDIOL 10 MG PO TABS
10.0000 mg | ORAL_TABLET | Freq: Every day | ORAL | 3 refills | Status: DC
  Filled 2022-12-27: qty 90, 90d supply, fill #0
  Filled 2023-04-04: qty 90, 90d supply, fill #1
  Filled 2023-07-06: qty 90, 90d supply, fill #2
  Filled 2023-10-02: qty 30, 30d supply, fill #3
  Filled 2023-10-02: qty 90, 90d supply, fill #3
  Filled 2023-10-30: qty 30, 30d supply, fill #4
  Filled 2023-12-03: qty 30, 30d supply, fill #5

## 2023-01-01 DIAGNOSIS — H524 Presbyopia: Secondary | ICD-10-CM | POA: Diagnosis not present

## 2023-02-14 ENCOUNTER — Other Ambulatory Visit (HOSPITAL_COMMUNITY): Payer: Self-pay

## 2023-04-04 ENCOUNTER — Other Ambulatory Visit (HOSPITAL_COMMUNITY): Payer: Self-pay

## 2023-04-05 ENCOUNTER — Other Ambulatory Visit (HOSPITAL_COMMUNITY): Payer: Self-pay

## 2023-04-05 MED ORDER — PRAVASTATIN SODIUM 40 MG PO TABS
40.0000 mg | ORAL_TABLET | Freq: Every day | ORAL | 1 refills | Status: DC
  Filled 2023-04-05: qty 90, 90d supply, fill #0
  Filled 2023-07-06: qty 90, 90d supply, fill #1

## 2023-04-07 ENCOUNTER — Other Ambulatory Visit (HOSPITAL_COMMUNITY): Payer: Self-pay

## 2023-04-09 ENCOUNTER — Other Ambulatory Visit (HOSPITAL_COMMUNITY): Payer: Self-pay

## 2023-04-09 ENCOUNTER — Other Ambulatory Visit: Payer: Self-pay

## 2023-04-09 MED ORDER — OZEMPIC (1 MG/DOSE) 4 MG/3ML ~~LOC~~ SOPN
1.0000 mg | PEN_INJECTOR | SUBCUTANEOUS | 1 refills | Status: AC
  Filled 2023-04-09: qty 9, 84d supply, fill #0
  Filled 2023-06-30 – 2023-07-06 (×2): qty 9, 84d supply, fill #1

## 2023-04-20 DIAGNOSIS — I10 Essential (primary) hypertension: Secondary | ICD-10-CM | POA: Diagnosis not present

## 2023-04-20 DIAGNOSIS — D649 Anemia, unspecified: Secondary | ICD-10-CM | POA: Diagnosis not present

## 2023-04-20 DIAGNOSIS — Z125 Encounter for screening for malignant neoplasm of prostate: Secondary | ICD-10-CM | POA: Diagnosis not present

## 2023-04-20 DIAGNOSIS — E7800 Pure hypercholesterolemia, unspecified: Secondary | ICD-10-CM | POA: Diagnosis not present

## 2023-04-20 DIAGNOSIS — E785 Hyperlipidemia, unspecified: Secondary | ICD-10-CM | POA: Diagnosis not present

## 2023-04-20 DIAGNOSIS — Z0000 Encounter for general adult medical examination without abnormal findings: Secondary | ICD-10-CM | POA: Diagnosis not present

## 2023-04-20 DIAGNOSIS — E1122 Type 2 diabetes mellitus with diabetic chronic kidney disease: Secondary | ICD-10-CM | POA: Diagnosis not present

## 2023-04-24 DIAGNOSIS — I10 Essential (primary) hypertension: Secondary | ICD-10-CM | POA: Diagnosis not present

## 2023-04-24 DIAGNOSIS — Z0000 Encounter for general adult medical examination without abnormal findings: Secondary | ICD-10-CM | POA: Diagnosis not present

## 2023-04-24 DIAGNOSIS — N182 Chronic kidney disease, stage 2 (mild): Secondary | ICD-10-CM | POA: Diagnosis not present

## 2023-04-24 DIAGNOSIS — E7800 Pure hypercholesterolemia, unspecified: Secondary | ICD-10-CM | POA: Diagnosis not present

## 2023-06-25 ENCOUNTER — Other Ambulatory Visit (HOSPITAL_COMMUNITY): Payer: Self-pay

## 2023-06-25 MED ORDER — OZEMPIC (2 MG/DOSE) 8 MG/3ML ~~LOC~~ SOPN
2.0000 mg | PEN_INJECTOR | SUBCUTANEOUS | 1 refills | Status: AC
  Filled 2023-06-25 – 2023-09-26 (×2): qty 3, 28d supply, fill #0
  Filled 2024-04-04: qty 3, 28d supply, fill #1
  Filled 2024-04-30: qty 3, 28d supply, fill #2
  Filled 2024-05-28: qty 3, 28d supply, fill #3

## 2023-07-02 ENCOUNTER — Other Ambulatory Visit (HOSPITAL_COMMUNITY): Payer: Self-pay

## 2023-07-06 ENCOUNTER — Other Ambulatory Visit (HOSPITAL_COMMUNITY): Payer: Self-pay

## 2023-07-06 ENCOUNTER — Other Ambulatory Visit: Payer: Self-pay

## 2023-07-30 ENCOUNTER — Other Ambulatory Visit (HOSPITAL_COMMUNITY): Payer: Self-pay

## 2023-07-30 MED ORDER — VALSARTAN-HYDROCHLOROTHIAZIDE 320-25 MG PO TABS
1.0000 | ORAL_TABLET | Freq: Every day | ORAL | 1 refills | Status: DC
  Filled 2023-07-30: qty 90, 90d supply, fill #0
  Filled 2023-10-26: qty 90, 90d supply, fill #1

## 2023-08-02 ENCOUNTER — Other Ambulatory Visit (HOSPITAL_COMMUNITY): Payer: Self-pay

## 2023-08-06 ENCOUNTER — Other Ambulatory Visit (HOSPITAL_COMMUNITY): Payer: Self-pay

## 2023-09-21 ENCOUNTER — Other Ambulatory Visit (HOSPITAL_COMMUNITY): Payer: Self-pay

## 2023-09-25 ENCOUNTER — Other Ambulatory Visit (HOSPITAL_COMMUNITY): Payer: Self-pay

## 2023-09-26 ENCOUNTER — Other Ambulatory Visit (HOSPITAL_COMMUNITY): Payer: Self-pay

## 2023-09-26 MED ORDER — OZEMPIC (2 MG/DOSE) 8 MG/3ML ~~LOC~~ SOPN
2.0000 mg | PEN_INJECTOR | SUBCUTANEOUS | 1 refills | Status: DC
  Filled 2023-09-26 – 2023-10-19 (×2): qty 9, 84d supply, fill #0
  Filled 2024-01-08: qty 9, 84d supply, fill #1

## 2023-10-02 ENCOUNTER — Other Ambulatory Visit (HOSPITAL_COMMUNITY): Payer: Self-pay

## 2023-10-02 ENCOUNTER — Other Ambulatory Visit: Payer: Self-pay

## 2023-10-02 MED ORDER — PRAVASTATIN SODIUM 40 MG PO TABS
40.0000 mg | ORAL_TABLET | Freq: Every day | ORAL | 1 refills | Status: DC
  Filled 2023-10-02: qty 90, 90d supply, fill #0
  Filled 2024-01-03: qty 90, 90d supply, fill #1

## 2023-10-19 ENCOUNTER — Other Ambulatory Visit (HOSPITAL_COMMUNITY): Payer: Self-pay

## 2023-10-19 DIAGNOSIS — E7800 Pure hypercholesterolemia, unspecified: Secondary | ICD-10-CM | POA: Diagnosis not present

## 2023-10-19 DIAGNOSIS — E1122 Type 2 diabetes mellitus with diabetic chronic kidney disease: Secondary | ICD-10-CM | POA: Diagnosis not present

## 2023-10-22 ENCOUNTER — Other Ambulatory Visit: Payer: Self-pay

## 2023-10-24 DIAGNOSIS — I10 Essential (primary) hypertension: Secondary | ICD-10-CM | POA: Diagnosis not present

## 2023-10-24 DIAGNOSIS — E1122 Type 2 diabetes mellitus with diabetic chronic kidney disease: Secondary | ICD-10-CM | POA: Diagnosis not present

## 2023-10-24 DIAGNOSIS — N182 Chronic kidney disease, stage 2 (mild): Secondary | ICD-10-CM | POA: Diagnosis not present

## 2023-10-24 DIAGNOSIS — E7800 Pure hypercholesterolemia, unspecified: Secondary | ICD-10-CM | POA: Diagnosis not present

## 2024-01-03 ENCOUNTER — Other Ambulatory Visit: Payer: Self-pay

## 2024-01-03 ENCOUNTER — Other Ambulatory Visit (HOSPITAL_COMMUNITY): Payer: Self-pay

## 2024-01-03 MED ORDER — DAPAGLIFLOZIN PROPANEDIOL 10 MG PO TABS
10.0000 mg | ORAL_TABLET | Freq: Every day | ORAL | 3 refills | Status: AC
  Filled 2024-01-03: qty 90, 90d supply, fill #0
  Filled 2024-03-30: qty 90, 90d supply, fill #1
  Filled 2024-06-26: qty 90, 90d supply, fill #2
  Filled 2024-06-26: qty 90, 90d supply, fill #0

## 2024-01-08 ENCOUNTER — Other Ambulatory Visit (HOSPITAL_COMMUNITY): Payer: Self-pay

## 2024-01-14 DIAGNOSIS — H524 Presbyopia: Secondary | ICD-10-CM | POA: Diagnosis not present

## 2024-01-22 ENCOUNTER — Other Ambulatory Visit (HOSPITAL_COMMUNITY): Payer: Self-pay

## 2024-01-22 MED ORDER — VALSARTAN-HYDROCHLOROTHIAZIDE 320-25 MG PO TABS
1.0000 | ORAL_TABLET | Freq: Every day | ORAL | 1 refills | Status: DC
  Filled 2024-01-22: qty 90, 90d supply, fill #0
  Filled 2024-04-24: qty 90, 90d supply, fill #1

## 2024-03-27 ENCOUNTER — Other Ambulatory Visit (HOSPITAL_COMMUNITY): Payer: Self-pay

## 2024-03-27 MED ORDER — PRAVASTATIN SODIUM 40 MG PO TABS
40.0000 mg | ORAL_TABLET | Freq: Every day | ORAL | 1 refills | Status: AC
  Filled 2024-03-27 – 2024-06-27 (×2): qty 90, 90d supply, fill #0

## 2024-04-23 DIAGNOSIS — E7800 Pure hypercholesterolemia, unspecified: Secondary | ICD-10-CM | POA: Diagnosis not present

## 2024-04-23 DIAGNOSIS — E1122 Type 2 diabetes mellitus with diabetic chronic kidney disease: Secondary | ICD-10-CM | POA: Diagnosis not present

## 2024-04-23 DIAGNOSIS — N182 Chronic kidney disease, stage 2 (mild): Secondary | ICD-10-CM | POA: Diagnosis not present

## 2024-04-23 DIAGNOSIS — Z125 Encounter for screening for malignant neoplasm of prostate: Secondary | ICD-10-CM | POA: Diagnosis not present

## 2024-04-30 ENCOUNTER — Other Ambulatory Visit (HOSPITAL_COMMUNITY): Payer: Self-pay

## 2024-04-30 DIAGNOSIS — I10 Essential (primary) hypertension: Secondary | ICD-10-CM | POA: Diagnosis not present

## 2024-04-30 DIAGNOSIS — Z0000 Encounter for general adult medical examination without abnormal findings: Secondary | ICD-10-CM | POA: Diagnosis not present

## 2024-04-30 DIAGNOSIS — E1122 Type 2 diabetes mellitus with diabetic chronic kidney disease: Secondary | ICD-10-CM | POA: Diagnosis not present

## 2024-04-30 DIAGNOSIS — N182 Chronic kidney disease, stage 2 (mild): Secondary | ICD-10-CM | POA: Diagnosis not present

## 2024-04-30 MED ORDER — PRAVASTATIN SODIUM 80 MG PO TABS
80.0000 mg | ORAL_TABLET | Freq: Every day | ORAL | 3 refills | Status: AC
  Filled 2024-04-30 – 2024-06-27 (×2): qty 90, 90d supply, fill #0

## 2024-06-23 ENCOUNTER — Other Ambulatory Visit (HOSPITAL_COMMUNITY): Payer: Self-pay

## 2024-06-24 ENCOUNTER — Other Ambulatory Visit (HOSPITAL_COMMUNITY): Payer: Self-pay

## 2024-06-24 MED ORDER — OZEMPIC (2 MG/DOSE) 8 MG/3ML ~~LOC~~ SOPN
2.0000 mg | PEN_INJECTOR | SUBCUTANEOUS | 1 refills | Status: AC
  Filled 2024-06-24 – 2024-06-27 (×2): qty 9, 84d supply, fill #0

## 2024-06-26 ENCOUNTER — Other Ambulatory Visit (HOSPITAL_COMMUNITY): Payer: Self-pay

## 2024-06-26 ENCOUNTER — Other Ambulatory Visit: Payer: Self-pay

## 2024-06-27 ENCOUNTER — Other Ambulatory Visit (HOSPITAL_COMMUNITY): Payer: Self-pay

## 2024-06-27 ENCOUNTER — Other Ambulatory Visit: Payer: Self-pay

## 2024-07-10 ENCOUNTER — Other Ambulatory Visit: Payer: Self-pay

## 2024-07-12 ENCOUNTER — Other Ambulatory Visit (HOSPITAL_COMMUNITY): Payer: Self-pay

## 2024-07-15 ENCOUNTER — Other Ambulatory Visit (HOSPITAL_COMMUNITY): Payer: Self-pay

## 2024-07-15 MED ORDER — VALSARTAN-HYDROCHLOROTHIAZIDE 320-25 MG PO TABS
1.0000 | ORAL_TABLET | Freq: Every day | ORAL | 1 refills | Status: AC
  Filled 2024-07-15: qty 90, 90d supply, fill #0

## 2024-07-16 ENCOUNTER — Other Ambulatory Visit: Payer: Self-pay
